# Patient Record
Sex: Male | Born: 2000 | Race: White | Hispanic: No | Marital: Single | Smoking: Never smoker
Health system: Southern US, Community
[De-identification: ages and names within clinical notes are randomized; demographics above are authoritative.]

## PROBLEM LIST (undated history)

## (undated) DIAGNOSIS — J302 Other seasonal allergic rhinitis: Secondary | ICD-10-CM

---
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
## 2018-11-27 IMAGING — CR DG WRIST COMPLETE 3+V*L*
1 series · 4 of 4 positions shown · non-contrast
Comparison: None.

CLINICAL DATA: Baseball injury with pain and deformity.

EXAM:
LEFT WRIST - COMPLETE 3+ VIEW

[Series 1: dg wrist complete left · 0.14mm/px · 4 of 4 slices shown]
[im 1/4]
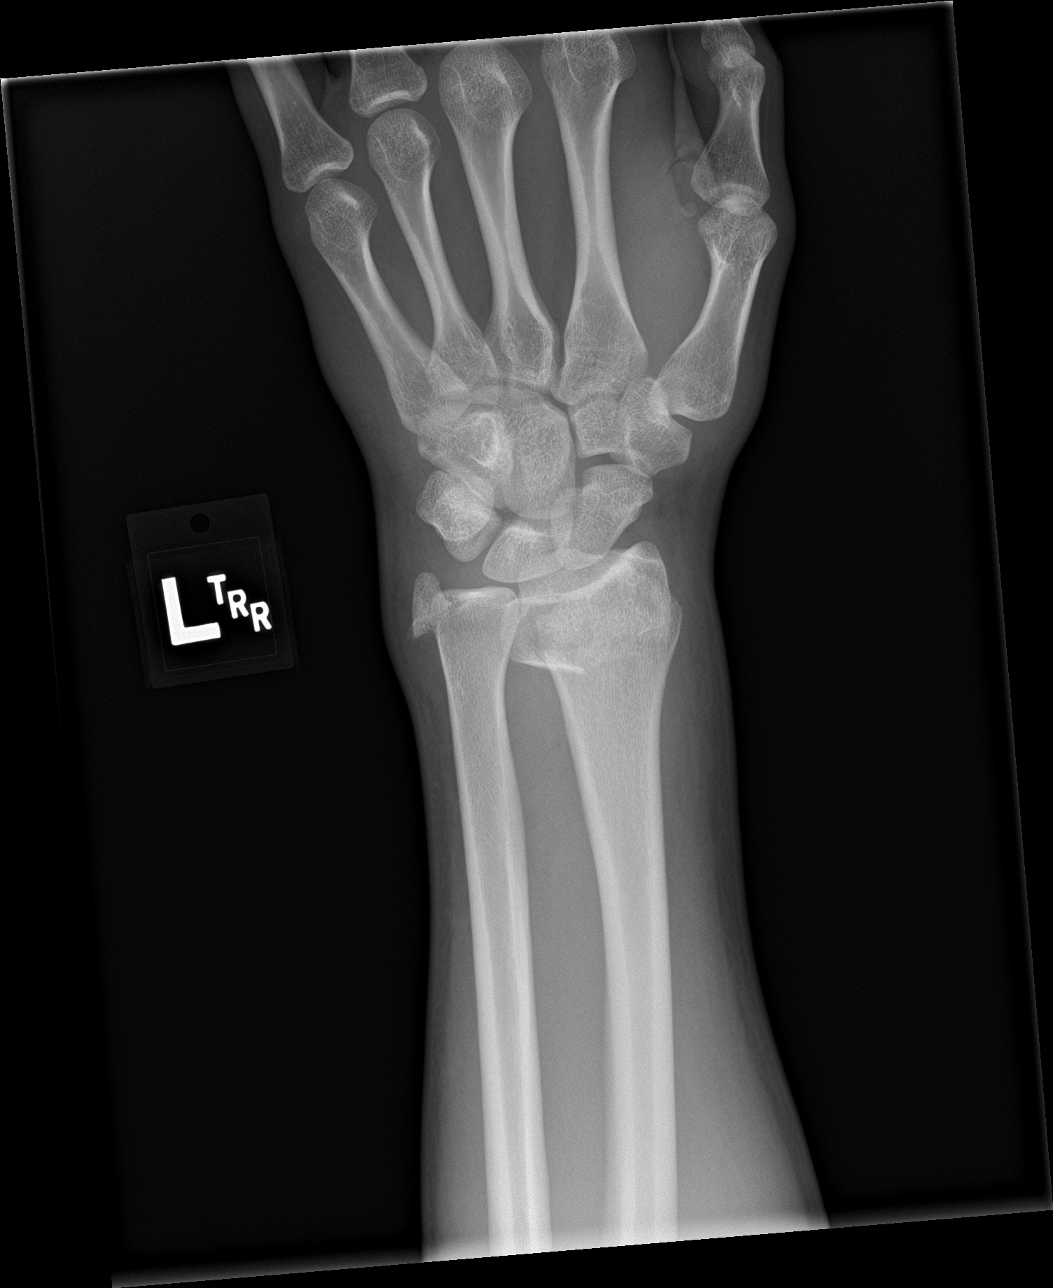
[im 2/4]
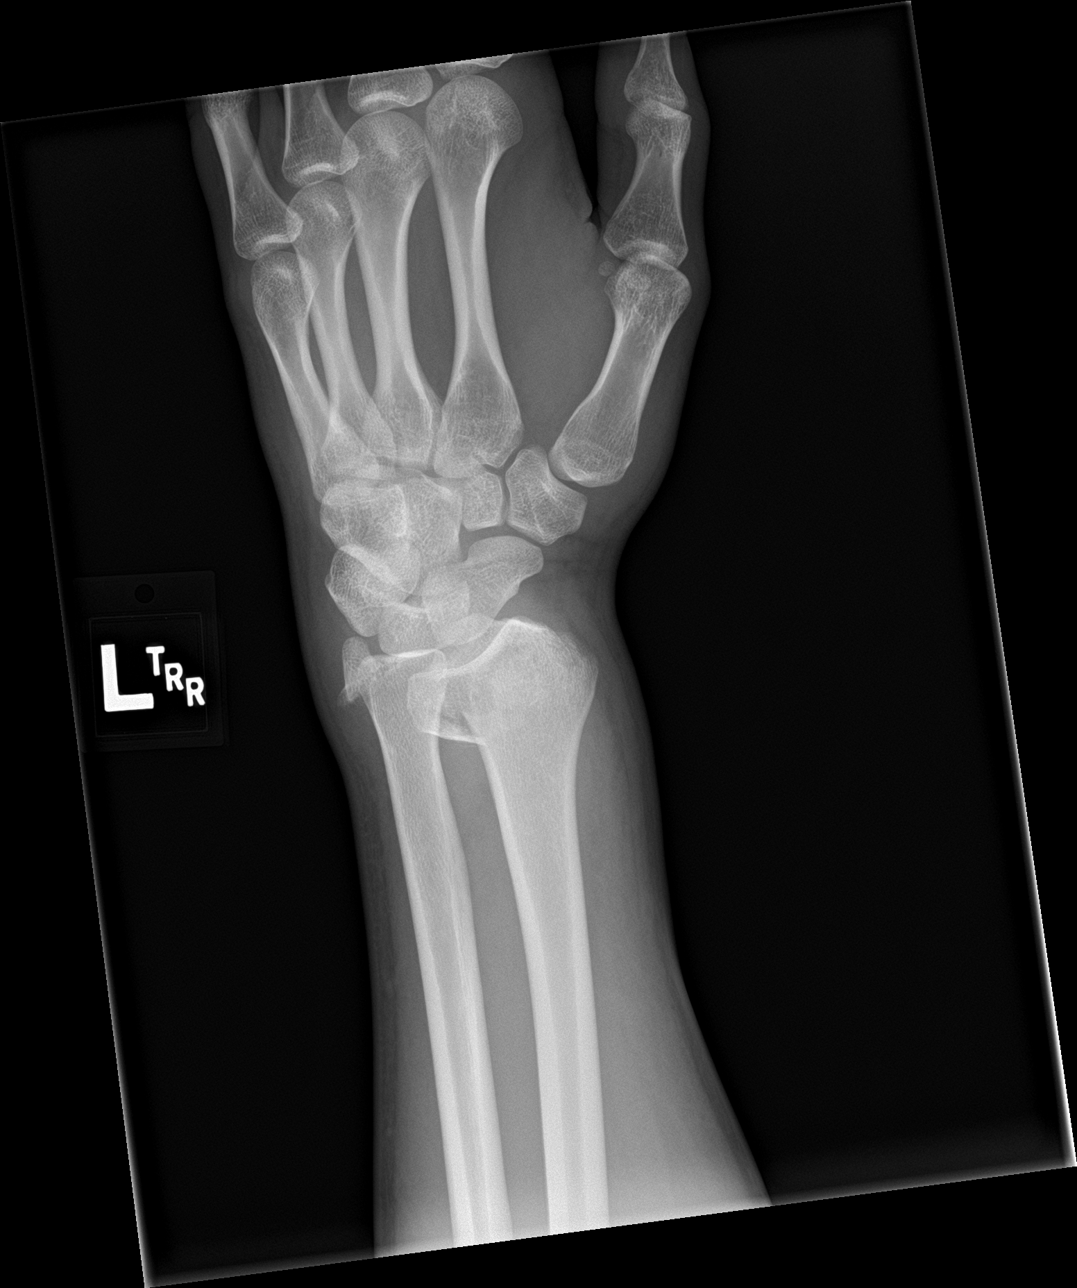
[im 3/4]
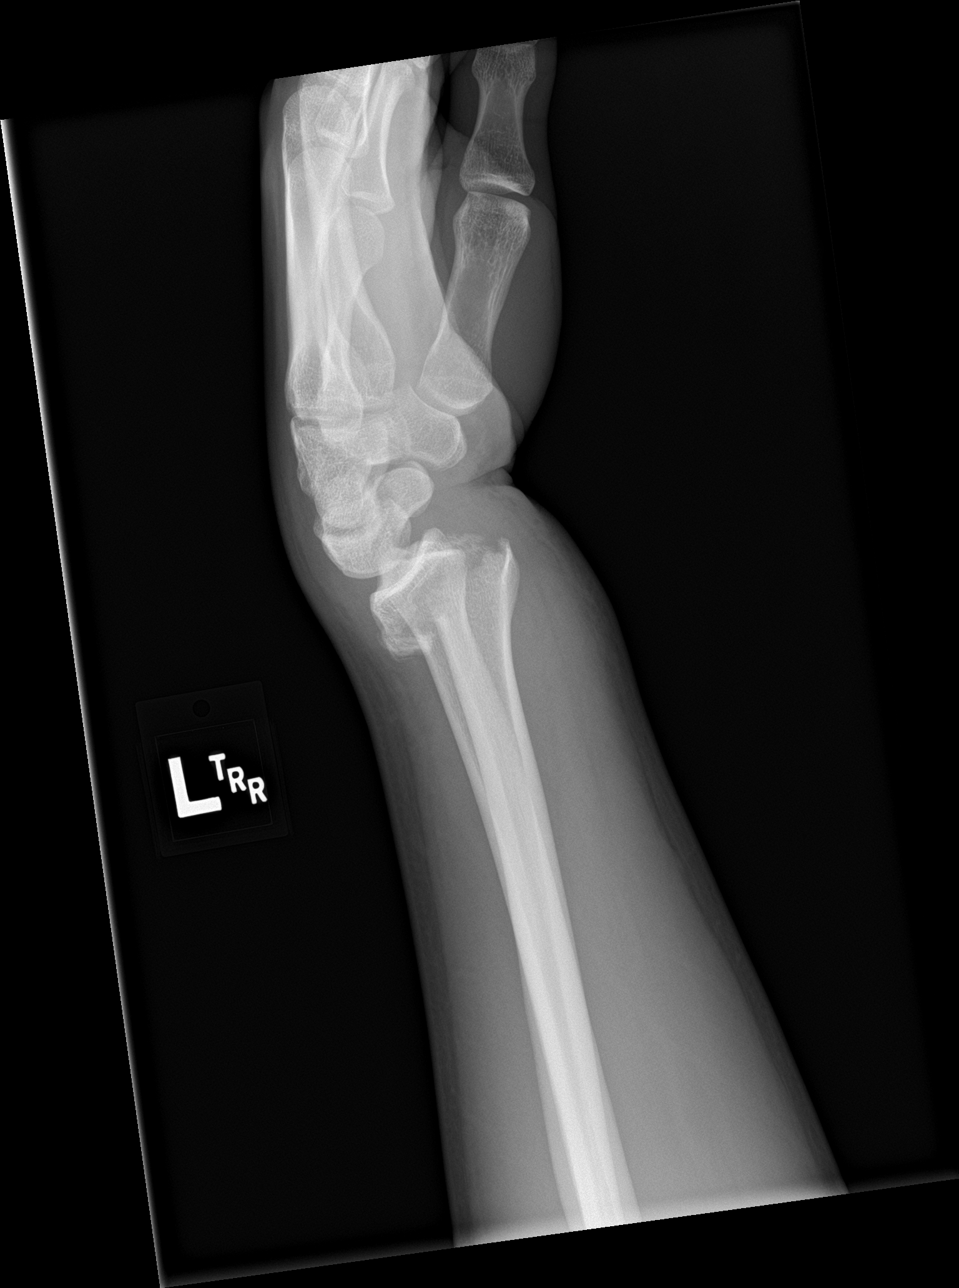
[im 4/4]
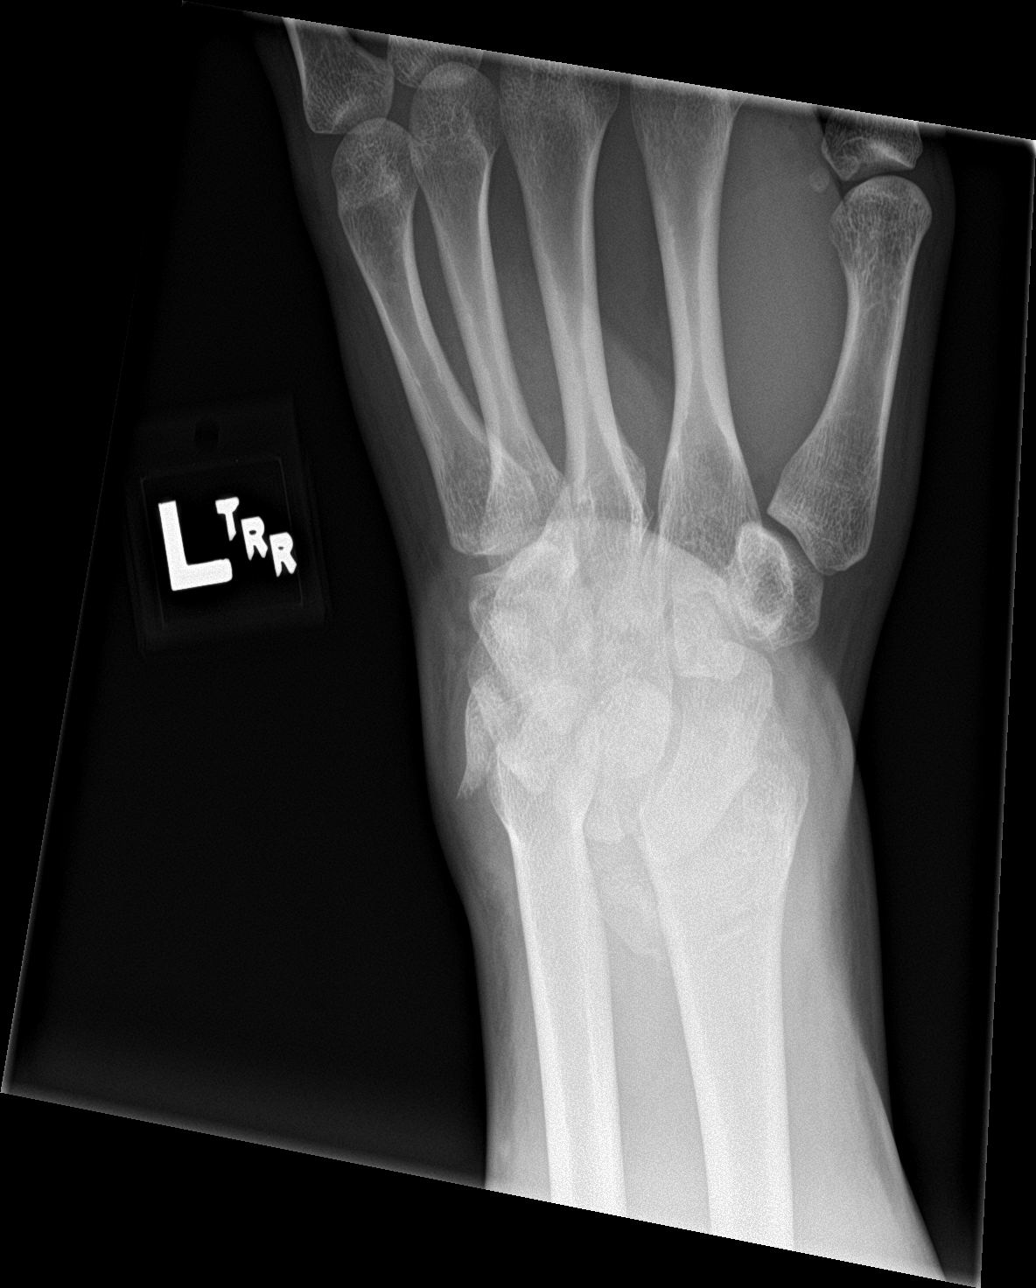

[4 of 4 positions shown; findings below may reference images not displayed]

FINDINGS: Colles type fractures of the distal radius and ulna with dorsal
displacement of the distal radial fragment and dorsal tilt of the
distal articular surface.
IMPRESSION: Colles fractures of the distal radius and ulnar styloid.
